# Patient Record
Sex: Female | Born: 1975 | Race: White | Hispanic: No | Marital: Single
Health system: Southern US, Community
[De-identification: ages and names within clinical notes are randomized; demographics above are authoritative.]

## PROBLEM LIST (undated history)

## (undated) HISTORY — PX: BOWEL RESECTION: SHX1257

## (undated) HISTORY — PX: HERNIA REPAIR: SHX51

---

## 2021-05-01 ENCOUNTER — Emergency Department: Payer: 59

## 2021-05-01 ENCOUNTER — Emergency Department
Admission: EM | Admit: 2021-05-01 | Discharge: 2021-05-01 | Disposition: A | Discharge status: Home / Self Care | Payer: 59 | Attending: Emergency Medicine | Admitting: Emergency Medicine

## 2021-05-01 ENCOUNTER — Other Ambulatory Visit: Payer: Self-pay

## 2021-05-01 DIAGNOSIS — U071 COVID-19: Secondary | ICD-10-CM | POA: Insufficient documentation

## 2021-05-01 DIAGNOSIS — R531 Weakness: Secondary | ICD-10-CM | POA: Diagnosis present

## 2021-05-01 LAB — BASIC METABOLIC PANEL
Anion gap: 7 (ref 5–15)
BUN: 12 mg/dL (ref 6–20)
CO2: 24 mmol/L (ref 22–32)
Calcium: 8.6 mg/dL — ABNORMAL LOW (ref 8.9–10.3)
Chloride: 103 mmol/L (ref 98–111)
Creatinine, Ser: 0.55 mg/dL (ref 0.44–1.00)
GFR, Estimated: >60 mL/min (ref >60)
Glucose, Bld: 115 mg/dL — ABNORMAL HIGH (ref 70–99)
Potassium: 3.7 mmol/L (ref 3.5–5.1)
Sodium: 134 mmol/L — ABNORMAL LOW (ref 135–145)

## 2021-05-01 LAB — CBC
HCT: 33.3 % — ABNORMAL LOW (ref 36.0–46.0)
Hemoglobin: 11.4 g/dL — ABNORMAL LOW (ref 12.0–15.0)
MCH: 30.2 pg (ref 26.0–34.0)
MCHC: 34.2 g/dL (ref 30.0–36.0)
MCV: 88.3 fL (ref 80.0–100.0)
Platelets: 160 10*3/uL (ref 150–400)
RBC: 3.77 MIL/uL — ABNORMAL LOW (ref 3.87–5.11)
RDW: 13.8 % (ref 11.5–15.5)
WBC: 5.1 10*3/uL (ref 4.0–10.5)
nRBC: 0 % (ref 0.0–0.2)

## 2021-05-01 LAB — HEPATIC FUNCTION PANEL
ALT: 12 U/L (ref 0–44)
AST: 20 U/L (ref 15–41)
Albumin: 3.8 g/dL (ref 3.5–5.0)
Alkaline Phosphatase: 24 U/L — ABNORMAL LOW (ref 38–126)
Bilirubin, Direct: 0.1 mg/dL (ref 0.0–0.2)
Indirect Bilirubin: 0.6 mg/dL (ref 0.3–0.9)
Total Bilirubin: 0.7 mg/dL (ref 0.3–1.2)
Total Protein: 6.5 g/dL (ref 6.5–8.1)

## 2021-05-01 LAB — POC SARS CORONAVIRUS 2 AG -  ED: SARSCOV2ONAVIRUS 2 AG: POSITIVE — AB

## 2021-05-01 LAB — LIPASE, BLOOD: Lipase: 26 U/L (ref 11–51)

## 2021-05-01 MED ORDER — PAXLOVID 20 X 150 MG & 10 X 100MG PO TBPK: 3.0000 | ORAL_TABLET | Freq: Two times a day (BID) | ORAL | 0 refills | Status: AC

## 2021-05-01 MED ORDER — ONDANSETRON 4 MG PO TBDP: 4.0000 mg | ORAL_TABLET | Freq: Three times a day (TID) | ORAL | 0 refills | Status: AC | PRN

## 2021-05-01 MED ORDER — ONDANSETRON HCL 4 MG/2ML IJ SOLN
4.0000 mg | Freq: Once | INTRAMUSCULAR | Status: AC
  Administered 2021-05-01: 4 mg via INTRAVENOUS
  Filled 2021-05-01: qty 2

## 2021-05-01 MED ORDER — SODIUM CHLORIDE 0.9 % IV BOLUS
500.0000 mL | Freq: Once | INTRAVENOUS | Status: AC
  Administered 2021-05-01: 500 mL via INTRAVENOUS

## 2021-05-01 MED ORDER — NAPROXEN 500 MG PO TABS: 500.0000 mg | ORAL_TABLET | Freq: Two times a day (BID) | ORAL | 0 refills | Status: AC

## 2021-05-01 MED ORDER — KETOROLAC TROMETHAMINE 30 MG/ML IJ SOLN
15.0000 mg | Freq: Once | INTRAMUSCULAR | Status: AC
  Administered 2021-05-01: 15 mg via INTRAVENOUS
  Filled 2021-05-01: qty 1

## 2021-05-01 NOTE — ED Notes (Signed)
Pt placed on 2L O2 via [place] due to O2 sats being 77%-86% on room air while resting. Improvement of sats to 97%. MD [facility] notified.

## 2021-05-01 NOTE — ED Notes (Signed)
Infusion rx and info provided x 2 rx provided for nausea and pain, all info regarding discharge given to patient , pt verbalizes understanding.

## 2021-05-01 NOTE — ED Notes (Signed)
Pt verbalizes understanding of MSE-waiver. Unable to sign at this time due to condition.

## 2021-05-01 NOTE — ED Triage Notes (Signed)
Pt to ER via [facility] with complaints of weakness that started last night, and nausea and vomiting that started this morning. Pt reports possible syncopal episode this morning. States her son is also sick with same symptoms. Denies diarrhea. Unknown covid contacts.  Pt also reports tick bite approx 2 weeks ago, no rash.   Ems VSS- HR 84 NSR, bp 129/82, o2 sats 94% RA, cbg 141.   EMS administered 4mg  iv zofran.

## 2021-05-01 NOTE — ED Provider Notes (Signed)
[facility] Emergency Department Provider Note  ____________________________________________  Time seen: Approximately 11:50 AM  I have reviewed the triage vital signs and the nursing notes.   HISTORY  Chief Complaint Weakness    HPI Misty Dean is a 45 y.o. female with no significant past medical history who comes ED complaining of generalized weakness, nausea vomiting, generalized headache, dizziness.  Her son at home also has similar symptoms.  Also reports a recent tick bite 2 weeks ago.  Not taking any medications.  Symptoms have been gradual onset, constant, waxing and waning, worsening, no aggravating or alleviating factors.      History reviewed. No pertinent past medical history.   There are no problems to display for this patient.    Past Surgical History:  Procedure Laterality Date  . BOWEL RESECTION    . HERNIA REPAIR       Prior to Admission medications   Medication Sig Start Date End Date Taking? Authorizing Provider  naproxen (NAPROSYN) 500 MG tablet Take 1 tablet (500 mg total) by mouth 2 (two) times daily with a meal. 05/01/21  Yes Sharman Cheek, MD  Nirmatrelvir & Ritonavir (PAXLOVID) 20 x 150 MG & 10 x 100MG  TBPK Take 3 tablets by mouth in the morning and at bedtime for 5 days. Take according to package instruction 05/01/21 05/06/21 Yes 05/08/21, MD  ondansetron (ZOFRAN ODT) 4 MG disintegrating tablet Take 1 tablet (4 mg total) by mouth every 8 (eight) hours as needed for nausea or vomiting. 05/01/21  Yes 05/03/21, MD     Allergies Penicillins   No family history on file.  Social History    Review of Systems  Constitutional:   No fever positive chills.  Positive body aches ENT:   No sore throat. No rhinorrhea. Cardiovascular:   No chest pain or syncope. Respiratory:   No dyspnea or cough. Gastrointestinal:   Negative for abdominal pain, vomiting and diarrhea.  Musculoskeletal:   Negative for  focal pain or swelling All other systems reviewed and are negative except as documented above in ROS and HPI.  ____________________________________________   PHYSICAL EXAM:  VITAL SIGNS: ED Triage Vitals  Enc Vitals Group     BP 05/01/21 0710 116/67     Pulse Rate 05/01/21 0710 85     Resp 05/01/21 0710 16     Temp 05/01/21 0710 98.6 F (37 C)     Temp Source 05/01/21 0710 Oral     SpO2 05/01/21 0710 98 %     Weight 05/01/21 0711 175 lb (79.4 kg)     Height 05/01/21 0711 5\' 7"  (1.702 m)     Head Circumference --      Peak Flow --      Pain Score 05/01/21 0711 6     Pain Loc --      Pain Edu? --      Excl. in GC? --     Vital signs reviewed, nursing assessments reviewed.   Constitutional:   Alert and oriented. Non-toxic appearance. Eyes:   Conjunctivae are normal. EOMI. PERRL. ENT      Head:   Normocephalic and atraumatic.      Nose:   Wearing a mask.      Mouth/Throat:   Wearing a mask.      Neck:   No meningismus. Full ROM. Hematological/Lymphatic/Immunilogical:   No cervical lymphadenopathy. Cardiovascular:   RRR. Symmetric bilateral radial and DP pulses.  No murmurs. Cap refill less than 2 seconds. Respiratory:  Normal respiratory effort without tachypnea/retractions. Breath sounds are clear and equal bilaterally. No wheezes/rales/rhonchi. Gastrointestinal:   Soft and nontender. Non distended. There is no CVA tenderness.  No rebound, rigidity, or guarding. Genitourinary:   deferred Musculoskeletal:   Normal range of motion in all extremities. No joint effusions.  No lower extremity tenderness.  No edema. Neurologic:   Normal speech and language.  Motor grossly intact. No acute focal neurologic deficits are appreciated.  Skin:    Skin is warm, dry and intact. No rash noted.  No petechiae, purpura, or bullae.  ____________________________________________    LABS (pertinent positives/negatives) (all labs ordered are listed, but only abnormal results are  displayed) Labs Reviewed  BASIC METABOLIC PANEL - Abnormal; Notable for the following components:      Result Value   Sodium 134 (*)    Glucose, Bld 115 (*)    Calcium 8.6 (*)    All other components within normal limits  CBC - Abnormal; Notable for the following components:   RBC 3.77 (*)    Hemoglobin 11.4 (*)    HCT 33.3 (*)    All other components within normal limits  HEPATIC FUNCTION PANEL - Abnormal; Notable for the following components:   Alkaline Phosphatase 24 (*)    All other components within normal limits  POC SARS CORONAVIRUS 2 AG -  ED - Abnormal; Notable for the following components:   SARSCOV2ONAVIRUS 2 AG POSITIVE (*)    All other components within normal limits  LIPASE, BLOOD  URINALYSIS, COMPLETE (UACMP) WITH MICROSCOPIC  CBG MONITORING, ED  POC URINE PREG, ED   ____________________________________________   EKG  Interpreted by me Sinus rhythm rate of 84, normal axis and intervals.  Normal QRS ST segments and T waves.  ____________________________________________    RADIOLOGY  DG Chest Portable 1 View  Result Date: 05/01/2021 CLINICAL DATA:  Hypoxia EXAM: PORTABLE CHEST 1 VIEW COMPARISON:  None. FINDINGS: Heart and mediastinal contours are within normal limits. No focal opacities or effusions. No acute bony abnormality. IMPRESSION: No active disease. Electronically Signed   By: Charlett Nose M.D.   On: 05/01/2021 09:11    ____________________________________________   PROCEDURES Procedures  ____________________________________________  DIFFERENTIAL DIAGNOSIS   Viral syndrome, pneumonia, migraine headache, dehydration  CLINICAL IMPRESSION / ASSESSMENT AND PLAN / ED COURSE  Medications ordered in the ED: Medications  ketorolac (TORADOL) 30 MG/ML injection 15 mg (15 mg Intravenous Given 05/01/21 0725)  sodium chloride 0.9 % bolus 500 mL (500 mLs Intravenous New Bag/Given 05/01/21 1126)  ondansetron (ZOFRAN) injection 4 mg (4 mg Intravenous  Given 05/01/21 1126)    Pertinent labs & imaging results that were available during my care of the patient were reviewed by me and considered in my medical decision making (see chart for details).  Misty Dean was evaluated in Emergency Department on 05/01/2021 for the symptoms described in the history of present illness. She was evaluated in the context of the global COVID-19 pandemic, which necessitated consideration that the patient might be at risk for infection with the SARS-CoV-2 virus that causes COVID-19. Institutional protocols and algorithms that pertain to the evaluation of patients at risk for COVID-19 are in a state of rapid change based on information released by regulatory bodies including the CDC and federal and state organizations. These policies and algorithms were followed during the patient's care in the ED.     Clinical Course as of 05/01/21 1154  Mon May 01, 2021  2694 Patient presents with malaise fatigue throbbing  generalized headache and vomiting.  Positive sick contact at home.  Will check COVID test, labs.  Vital signs normal.  Highly doubt meningitis or encephalitis.  No trauma. [PS]  1154 No evidence of rash, no fever related to recent tick bite.  Doxycycline not indicated. [PS]    Clinical Course User Index [PS] Sharman Cheek, MD     ----------------------------------------- 11:53 AM on 05/01/2021 -----------------------------------------  COVID-positive.  Labs unremarkable, vital signs normal, mental status normal, feeling better after anti-inflammatories.  We will give some IV fluids for hydration, discharged with prescriptions for Aleve and Zofran as well as Paxlovid ____________________________________________   FINAL CLINICAL IMPRESSION(S) / ED DIAGNOSES    Final diagnoses:  COVID-19 virus infection     ED Discharge Orders         Ordered    naproxen (NAPROSYN) 500 MG tablet  2 times daily with meals        05/01/21 1149    ondansetron  (ZOFRAN ODT) 4 MG disintegrating tablet  Every 8 hours PRN        05/01/21 1149    Ambulatory referral for Covid Treatment        05/01/21 1149    Nirmatrelvir & Ritonavir (PAXLOVID) 20 x 150 MG & 10 x 100MG  TBPK  2 times daily        05/01/21 1149          Portions of this note were generated with dragon dictation software. Dictation errors may occur despite best attempts at proofreading.   05/03/21, MD 05/01/21 1154

## 2022-02-07 IMAGING — DX DG CHEST 1V PORT
1 series · 1 of 1 positions shown · non-contrast
Comparison: None.

CLINICAL DATA: Hypoxia

EXAM:
PORTABLE CHEST 1 VIEW

[chest ap]
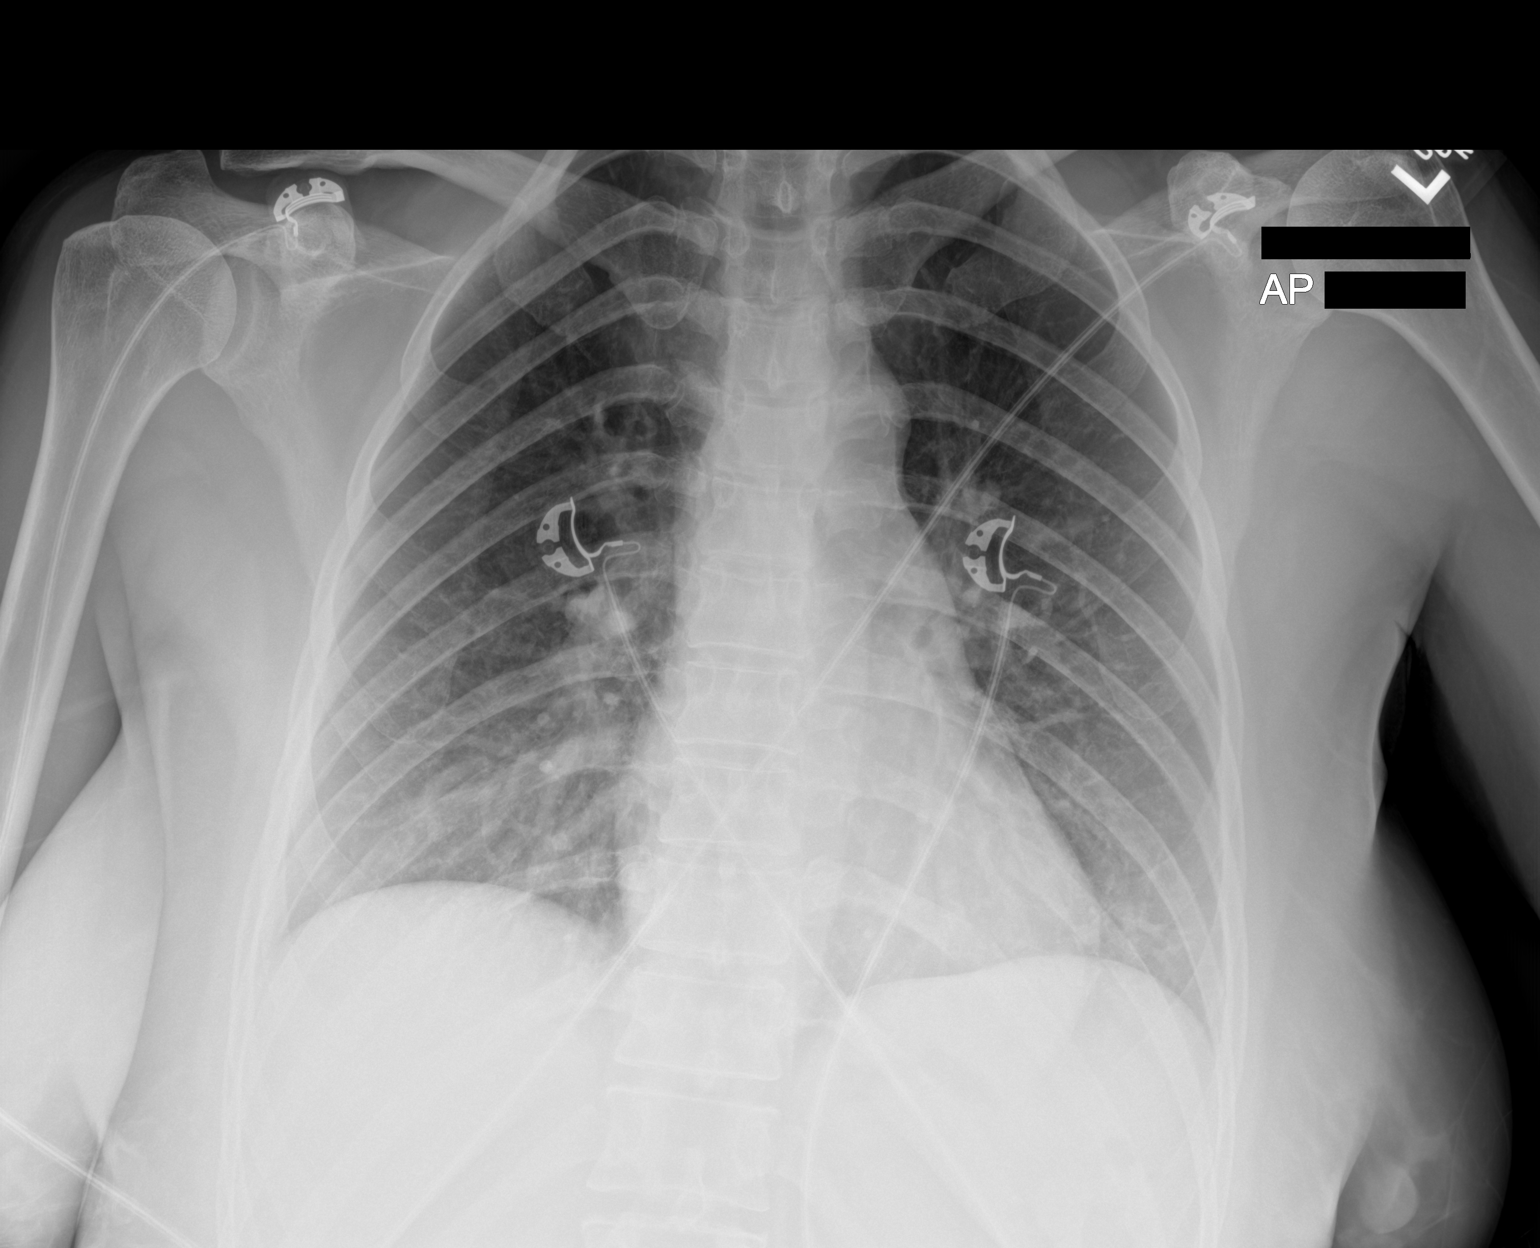

[1 of 1 positions shown; findings below may reference images not displayed]

FINDINGS: Heart and mediastinal contours are within normal limits. No focal
opacities or effusions. No acute bony abnormality.
IMPRESSION: No active disease.
